# Patient Record
Sex: Female | Born: 1965 | Race: Black or African American | Hispanic: No | Smoking: Never smoker
Health system: Southern US, Community
[De-identification: ages and names within clinical notes are randomized; demographics above are authoritative.]

## PROBLEM LIST (undated history)

## (undated) DIAGNOSIS — B379 Candidiasis, unspecified: Secondary | ICD-10-CM

## (undated) DIAGNOSIS — N159 Renal tubulo-interstitial disease, unspecified: Secondary | ICD-10-CM

## (undated) DIAGNOSIS — A499 Bacterial infection, unspecified: Secondary | ICD-10-CM

## (undated) DIAGNOSIS — N920 Excessive and frequent menstruation with regular cycle: Secondary | ICD-10-CM

## (undated) DIAGNOSIS — O009 Unspecified ectopic pregnancy without intrauterine pregnancy: Secondary | ICD-10-CM

## (undated) DIAGNOSIS — N6019 Diffuse cystic mastopathy of unspecified breast: Secondary | ICD-10-CM

## (undated) DIAGNOSIS — E119 Type 2 diabetes mellitus without complications: Secondary | ICD-10-CM

## (undated) DIAGNOSIS — F419 Anxiety disorder, unspecified: Secondary | ICD-10-CM

## (undated) DIAGNOSIS — N926 Irregular menstruation, unspecified: Secondary | ICD-10-CM

## (undated) DIAGNOSIS — D219 Benign neoplasm of connective and other soft tissue, unspecified: Secondary | ICD-10-CM

## (undated) DIAGNOSIS — N83209 Unspecified ovarian cyst, unspecified side: Secondary | ICD-10-CM

## (undated) DIAGNOSIS — M1990 Unspecified osteoarthritis, unspecified site: Secondary | ICD-10-CM

## (undated) DIAGNOSIS — A599 Trichomoniasis, unspecified: Secondary | ICD-10-CM

## (undated) DIAGNOSIS — N760 Acute vaginitis: Secondary | ICD-10-CM

## (undated) DIAGNOSIS — G4700 Insomnia, unspecified: Secondary | ICD-10-CM

## (undated) DIAGNOSIS — Z8619 Personal history of other infectious and parasitic diseases: Secondary | ICD-10-CM

## (undated) HISTORY — DX: Bacterial infection, unspecified: A49.9

## (undated) HISTORY — DX: Insomnia, unspecified: G47.00

## (undated) HISTORY — DX: Type 2 diabetes mellitus without complications: E11.9

## (undated) HISTORY — DX: Anxiety disorder, unspecified: F41.9

## (undated) HISTORY — PX: MYOMECTOMY: SHX85

## (undated) HISTORY — DX: Benign neoplasm of connective and other soft tissue, unspecified: D21.9

## (undated) HISTORY — DX: Acute vaginitis: N76.0

## (undated) HISTORY — DX: Unspecified ectopic pregnancy without intrauterine pregnancy: O00.90

## (undated) HISTORY — PX: LAPAROSCOPY: SHX197

## (undated) HISTORY — DX: Renal tubulo-interstitial disease, unspecified: N15.9

## (undated) HISTORY — DX: Candidiasis, unspecified: B37.9

## (undated) HISTORY — DX: Irregular menstruation, unspecified: N92.6

## (undated) HISTORY — DX: Unspecified osteoarthritis, unspecified site: M19.90

## (undated) HISTORY — DX: Trichomoniasis, unspecified: A59.9

## (undated) HISTORY — DX: Unspecified ovarian cyst, unspecified side: N83.209

## (undated) HISTORY — DX: Personal history of other infectious and parasitic diseases: Z86.19

## (undated) HISTORY — DX: Excessive and frequent menstruation with regular cycle: N92.0

## (undated) HISTORY — DX: Diffuse cystic mastopathy of unspecified breast: N60.19

---

## 2000-09-03 ENCOUNTER — Encounter: Admission: RE | Admit: 2000-09-03 | Discharge: 2000-12-02 | Payer: Self-pay | Admitting: Internal Medicine

## 2001-03-18 ENCOUNTER — Other Ambulatory Visit: Admission: RE | Admit: 2001-03-18 | Discharge: 2001-03-18 | Payer: Self-pay | Admitting: Obstetrics and Gynecology

## 2001-08-05 ENCOUNTER — Inpatient Hospital Stay (HOSPITAL_COMMUNITY): Admission: AD | Admit: 2001-08-05 | Discharge: 2001-08-05 | Payer: Self-pay | Admitting: Obstetrics and Gynecology

## 2001-08-05 ENCOUNTER — Encounter: Payer: Self-pay | Admitting: Obstetrics and Gynecology

## 2001-08-08 ENCOUNTER — Inpatient Hospital Stay (HOSPITAL_COMMUNITY): Admission: AD | Admit: 2001-08-08 | Discharge: 2001-08-08 | Payer: Self-pay | Admitting: Obstetrics and Gynecology

## 2002-03-18 ENCOUNTER — Other Ambulatory Visit: Admission: RE | Admit: 2002-03-18 | Discharge: 2002-03-18 | Payer: Self-pay | Admitting: Obstetrics and Gynecology

## 2002-05-06 ENCOUNTER — Encounter: Payer: Self-pay | Admitting: Obstetrics and Gynecology

## 2002-05-06 ENCOUNTER — Encounter: Admission: RE | Admit: 2002-05-06 | Discharge: 2002-05-06 | Payer: Self-pay | Admitting: Obstetrics and Gynecology

## 2003-05-11 ENCOUNTER — Other Ambulatory Visit: Admission: RE | Admit: 2003-05-11 | Discharge: 2003-05-11 | Payer: Self-pay | Admitting: Obstetrics and Gynecology

## 2004-02-22 ENCOUNTER — Ambulatory Visit (HOSPITAL_COMMUNITY): Admission: RE | Admit: 2004-02-22 | Discharge: 2004-02-22 | Payer: Self-pay | Admitting: Internal Medicine

## 2004-05-12 ENCOUNTER — Other Ambulatory Visit: Admission: RE | Admit: 2004-05-12 | Discharge: 2004-05-12 | Payer: Self-pay | Admitting: Obstetrics and Gynecology

## 2004-09-29 IMAGING — CT CT HEAD W/O CM
2 of 3 series · 14 of 33 positions shown, 18 images · non-contrast
Comparison: none

CLINICAL DATA: Headache, confusion.

[Series 2: — · sagittal · 0.67mm/px · 1 of 3 slices shown (1 of 2)]
[im 2/3  brain]
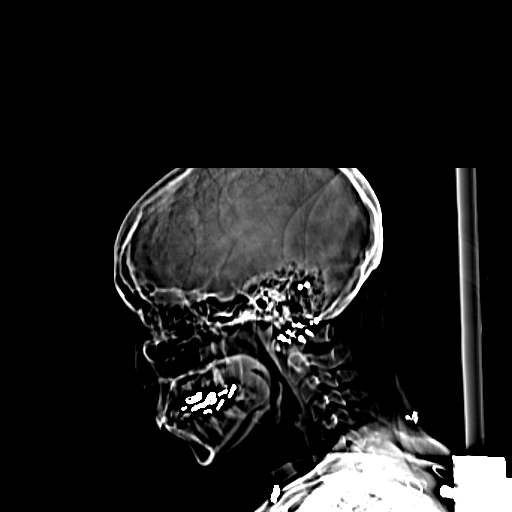

[Series 3: — · axial · 0.43mm/px · z∈[-125,-10]mm · 13 of 29 slices shown, 17 images (2 of 2)]
[im 3/29  brain]
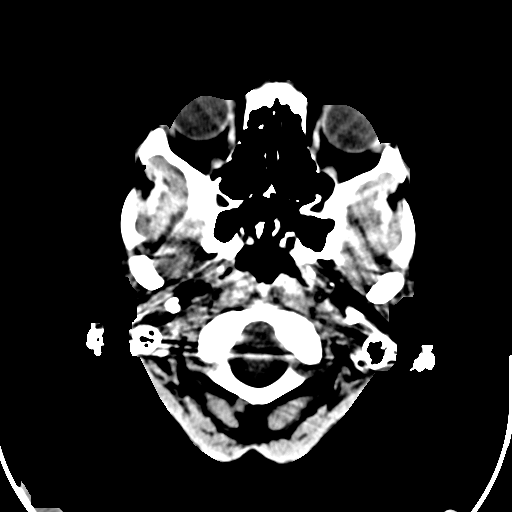
[im 3/29  bone]
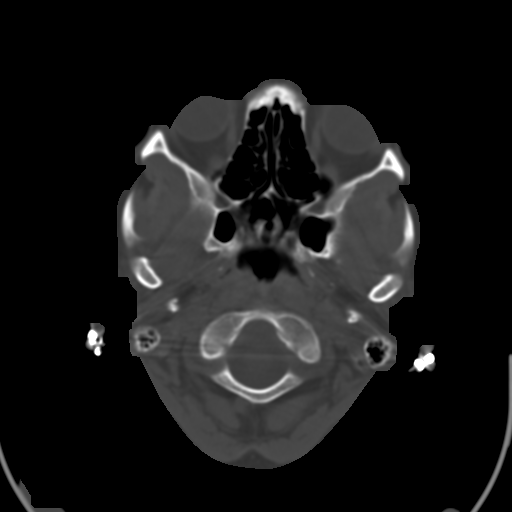
[im 5/29  brain]
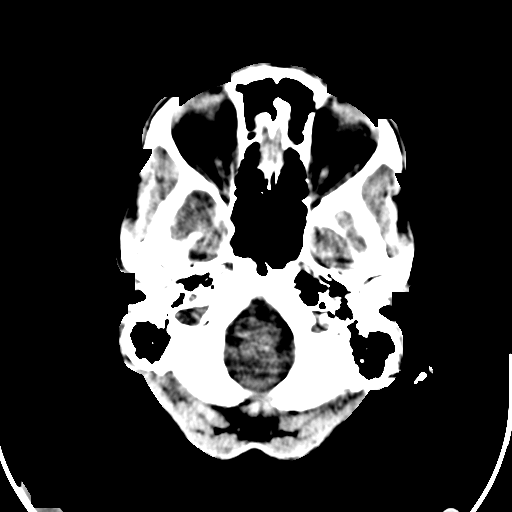
[im 7/29  brain]
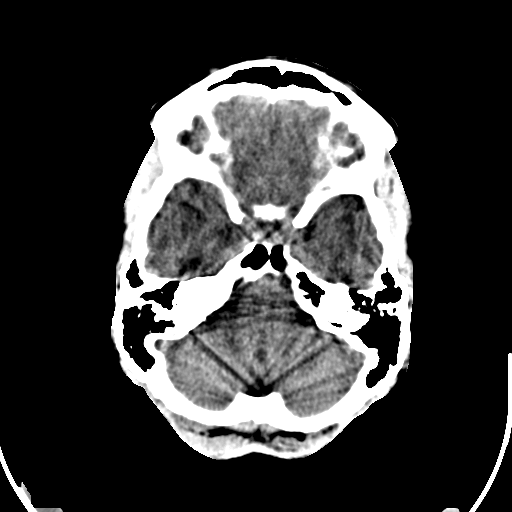
[im 9/29  brain]
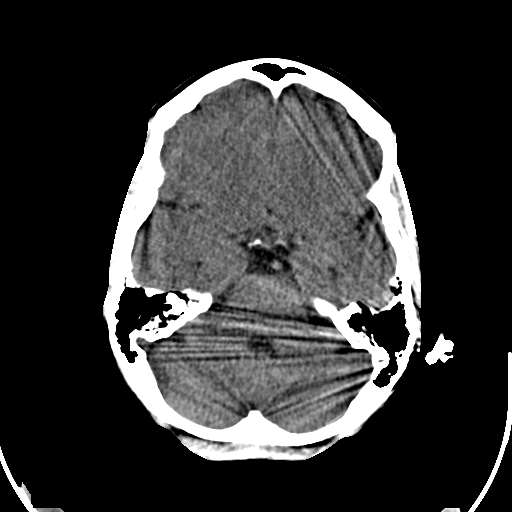
[im 11/29  brain]
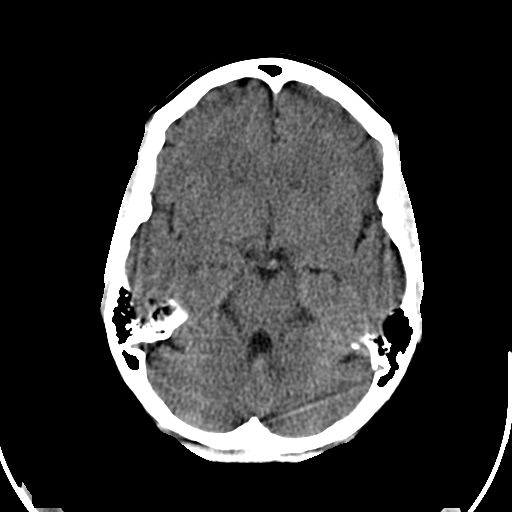
[im 11/29  bone]
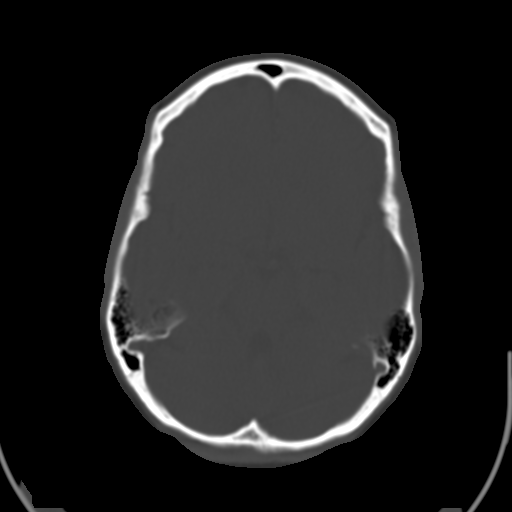
[im 13/29  brain]
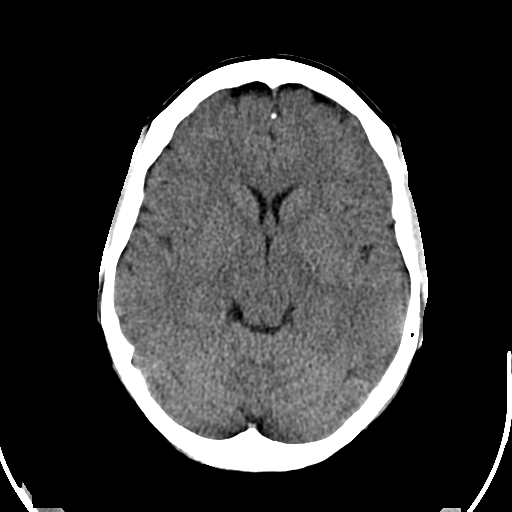
[im 15/29  brain]
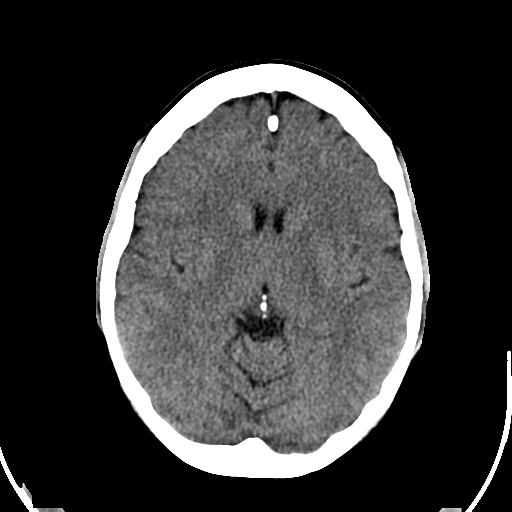
[im 17/29  brain]
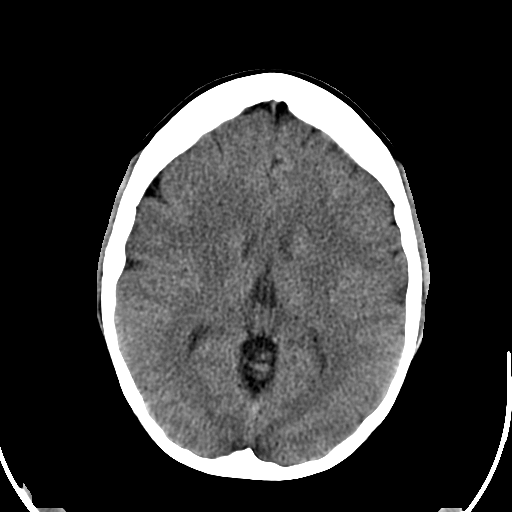
[im 19/29  brain]
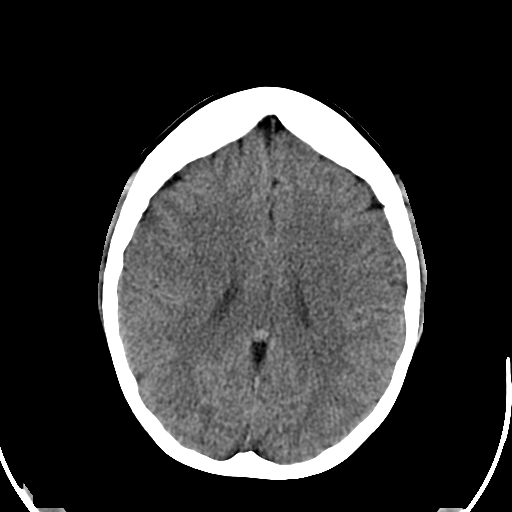
[im 19/29  bone]
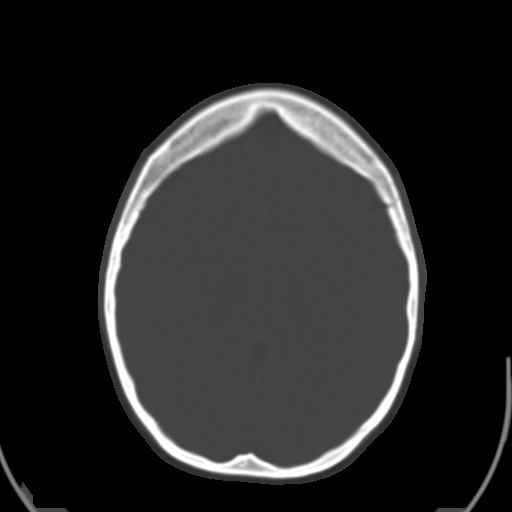
[im 21/29  brain]
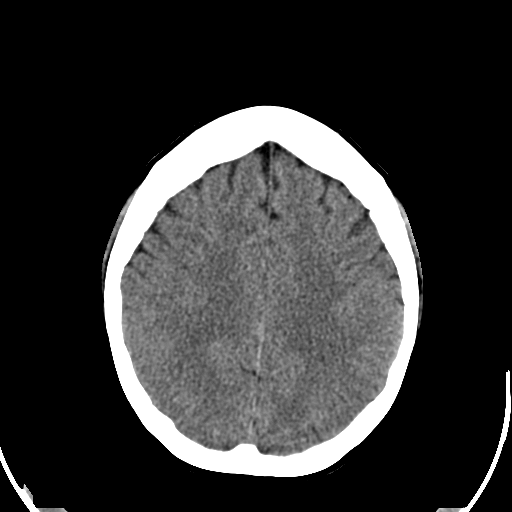
[im 23/29  brain]
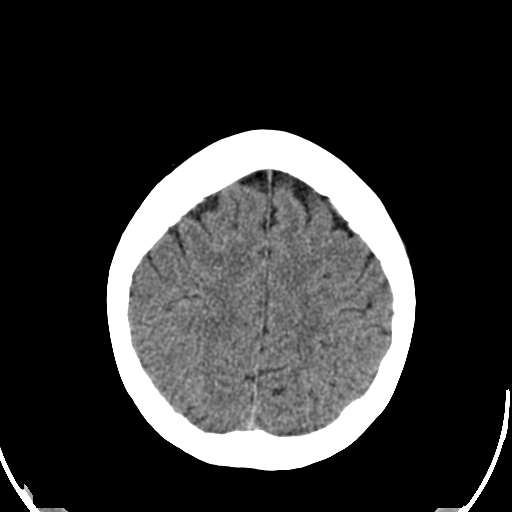
[im 25/29  brain]
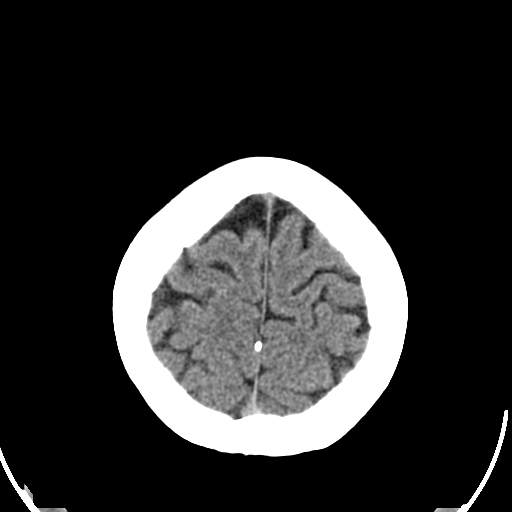
[im 27/29  brain]
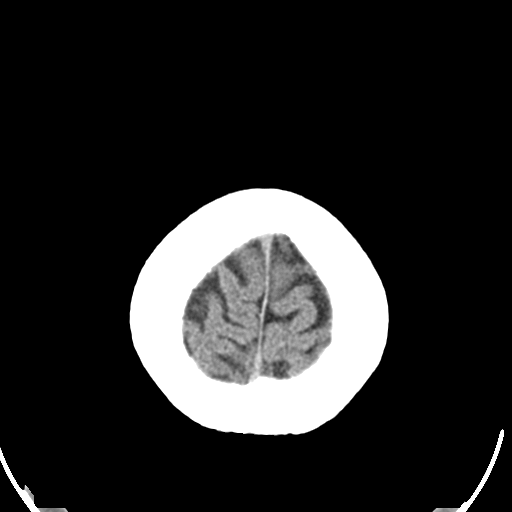
[im 27/29  bone]
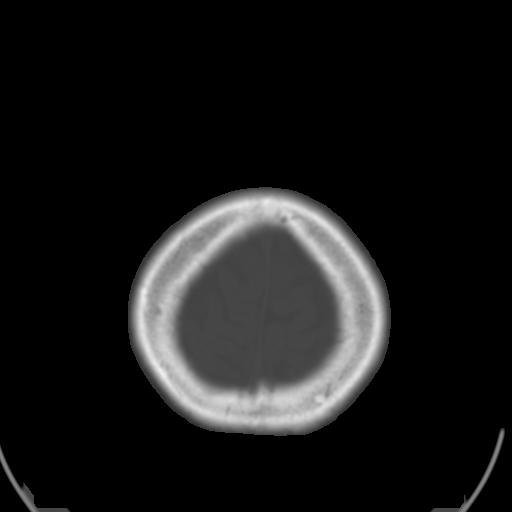

[14 of 33 positions shown; findings below may reference images not displayed]

HEAD CT WITHOUT CONTRAST
 Routine non-contrast head CT was performed. 

 There is no evidence of intracranial hemorrhage, brain edema, or mass effect. The ventricles are normal. No extra-axial abnormalities are identified. Bone windows show no significant abnormalities.

 IMPRESSION
 Negative non-contrast head CT.

## 2005-06-08 ENCOUNTER — Other Ambulatory Visit: Admission: RE | Admit: 2005-06-08 | Discharge: 2005-06-08 | Payer: Self-pay | Admitting: Obstetrics and Gynecology

## 2006-04-10 ENCOUNTER — Encounter: Admission: RE | Admit: 2006-04-10 | Discharge: 2006-04-10 | Payer: Self-pay | Admitting: Obstetrics and Gynecology

## 2007-04-16 ENCOUNTER — Encounter: Admission: RE | Admit: 2007-04-16 | Discharge: 2007-04-16 | Payer: Self-pay | Admitting: Obstetrics and Gynecology

## 2008-04-23 ENCOUNTER — Encounter: Admission: RE | Admit: 2008-04-23 | Discharge: 2008-04-23 | Payer: Self-pay | Admitting: Internal Medicine

## 2009-05-06 ENCOUNTER — Encounter: Admission: RE | Admit: 2009-05-06 | Discharge: 2009-05-06 | Payer: Self-pay | Admitting: Obstetrics and Gynecology

## 2010-05-10 ENCOUNTER — Encounter: Admission: RE | Admit: 2010-05-10 | Discharge: 2010-05-10 | Payer: Self-pay | Admitting: Obstetrics and Gynecology

## 2011-04-20 ENCOUNTER — Other Ambulatory Visit: Payer: Self-pay | Admitting: Obstetrics and Gynecology

## 2011-04-20 DIAGNOSIS — Z1231 Encounter for screening mammogram for malignant neoplasm of breast: Secondary | ICD-10-CM

## 2011-05-16 ENCOUNTER — Ambulatory Visit
Admission: RE | Admit: 2011-05-16 | Discharge: 2011-05-16 | Disposition: A | Discharge status: Home / Self Care | Payer: BC Managed Care – PPO | Source: Ambulatory Visit | Attending: Obstetrics and Gynecology | Admitting: Obstetrics and Gynecology

## 2011-05-16 DIAGNOSIS — Z1231 Encounter for screening mammogram for malignant neoplasm of breast: Secondary | ICD-10-CM

## 2011-05-24 ENCOUNTER — Other Ambulatory Visit: Payer: Self-pay | Admitting: Obstetrics and Gynecology

## 2011-05-24 DIAGNOSIS — R928 Other abnormal and inconclusive findings on diagnostic imaging of breast: Secondary | ICD-10-CM

## 2011-06-08 ENCOUNTER — Ambulatory Visit
Admission: RE | Admit: 2011-06-08 | Discharge: 2011-06-08 | Disposition: A | Discharge status: Home / Self Care | Payer: BC Managed Care – PPO | Source: Ambulatory Visit | Attending: Obstetrics and Gynecology | Admitting: Obstetrics and Gynecology

## 2011-06-08 DIAGNOSIS — R928 Other abnormal and inconclusive findings on diagnostic imaging of breast: Secondary | ICD-10-CM

## 2011-06-09 ENCOUNTER — Other Ambulatory Visit: Payer: BC Managed Care – PPO

## 2012-01-25 ENCOUNTER — Encounter: Payer: Self-pay | Admitting: Obstetrics and Gynecology

## 2012-01-25 ENCOUNTER — Ambulatory Visit (INDEPENDENT_AMBULATORY_CARE_PROVIDER_SITE_OTHER): Payer: BC Managed Care – PPO | Admitting: Obstetrics and Gynecology

## 2012-01-25 VITALS — BP 110/74 | Ht 61.0 in | Wt 139.0 lb

## 2012-01-25 DIAGNOSIS — Z01419 Encounter for gynecological examination (general) (routine) without abnormal findings: Secondary | ICD-10-CM

## 2012-01-25 DIAGNOSIS — Z124 Encounter for screening for malignant neoplasm of cervix: Secondary | ICD-10-CM

## 2012-01-25 DIAGNOSIS — N760 Acute vaginitis: Secondary | ICD-10-CM

## 2012-01-25 DIAGNOSIS — D219 Benign neoplasm of connective and other soft tissue, unspecified: Secondary | ICD-10-CM

## 2012-01-25 DIAGNOSIS — D259 Leiomyoma of uterus, unspecified: Secondary | ICD-10-CM

## 2012-01-25 DIAGNOSIS — B9689 Other specified bacterial agents as the cause of diseases classified elsewhere: Secondary | ICD-10-CM

## 2012-01-25 DIAGNOSIS — G4700 Insomnia, unspecified: Secondary | ICD-10-CM | POA: Insufficient documentation

## 2012-01-25 DIAGNOSIS — A499 Bacterial infection, unspecified: Secondary | ICD-10-CM

## 2012-01-25 DIAGNOSIS — M069 Rheumatoid arthritis, unspecified: Secondary | ICD-10-CM

## 2012-01-25 MED ORDER — ZOLPIDEM TARTRATE 10 MG PO TABS: 10.0000 mg | ORAL_TABLET | Freq: Every evening | ORAL | Status: DC | PRN

## 2012-01-25 NOTE — Progress Notes (Addendum)
The patient reports:normal menses, no abnormal bleeding, pelvic pain or discharge  Contraception:IUD  Last mammogram: was normal October 2012 Last pap: was abnormal: ascus  February  2010  GC/Chlamydia cultures offered: declined HIV/RPR/HbsAg offered:  declined HSV 1 and 2 glycoprotein offered: declined  Menstrual cycle regular and monthly: No: IUD  Menstrual flow normal: No: IUD   Urinary symptoms: none Normal bowel movements: Yes Reports abuse at home: No:   ANNUAL GYNECOLOGIC EXAMINATION   Sara Kaiser is a 46 y.o. female, G1P0010, who presents for an annual exam. See above. The patient had a Mirena IUD placed in January 2009.  She does not have periods.  She is very pleased.  She has rheumatoid arthritis and is doing well.  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  She does well with boric acid suppositories.  She takes Ambien for insomnia.  She has started having hot flashes.  Prior Hysterectomy: No    History   Social History  . Marital Status: Divorced    Spouse Name: N/A    Number of Children: N/A  . Years of Education: N/A   Social History Main Topics  . Smoking status: Never Smoker   . Smokeless tobacco: Never Used  . Alcohol Use: No  . Drug Use: No  . Sexually Active: Yes    Birth Control/ Protection: IUD   Other Topics Concern  . None   Social History Narrative  . None    Menstrual cycle:   LMP: No LMP recorded. Patient is not currently having periods (Reason: IUD).           Cycle: no cycle because of IUD  The following portions of the patient's history were reviewed and updated as appropriate: allergies, current medications, past family history, past medical history, past social history, past surgical history and problem list.  Review of Systems Pertinent items are noted in HPI. Breast:Negative for breast lump,nipple discharge or nipple retraction Gastrointestinal: Negative for abdominal pain, change in bowel habits or rectal  bleeding Urinary:negative   Objective:    BP 110/74  Ht 5\' 1"  (1.549 m)  Wt 139 lb (63.05 kg)  BMI 26.26 kg/m2    Weight:  Wt Readings from Last 1 Encounters:  01/25/12 139 lb (63.05 kg)          BMI: Body mass index is 26.26 kg/(m^2).  General Appearance: Alert, appropriate appearance for age. No acute distress HEENT: Grossly normal Neck / Thyroid: Supple, no masses, nodes or enlargement Lungs: clear to auscultation bilaterally Back: No CVA tenderness Breast Exam: No masses or nodes.No dimpling, nipple retraction or discharge. Cardiovascular: Regular rate and rhythm. S1, S2, no murmur Gastrointestinal: Soft, non-tender, no masses or organomegaly  ++++++++++++++++++++++++++++++++++++++++++++++++++++++++  Pelvic Exam: External genitalia: normal general appearance Vaginal: normal without tenderness, induration or masses and relaxation: No Cervix: normal appearance, IUD string present Adnexa: normal bimanual exam Uterus: normal size, shape, and consistency Rectovaginal: normal rectal, no masses  ++++++++++++++++++++++++++++++++++++++++++++++++++++++++  Lymphatic Exam: Non-palpable nodes in neck, clavicular, axillary, or inguinal regions Neurologic: Normal speech, no tremor  Psychiatric: Alert and oriented, appropriate affect.   Wet Prep:   not applicable Urinalysis:  not applicable UPT:           Not done   Assessment:    Normal gyn exam   Overweight or obese: No   Pelvic relaxation: No  Rheumatoid arthritis  Insomnia  Menopausal symptoms  Recurrent vaginosis   Plan:    mammogram pap smear return annually or prn Contraception:IUD, Remove old  IUD and reinsert new one in January 2014   Medications prescribed: Ambien 10 mg 1 tablet daily at bedtime when necessary insomnia, 30 tablets, 2 refills Boric acid suppositories 600 mg one per vagina twice each week, 25, refill for one year  STD screen request: none  RPR: No.   HBsAg: No.  Hepatitis C:  No.  The updated Pap smear screening guidelines were discussed with the patient. The patient requested that I obtain a Pap smear: Yes.  Kegel exercises discussed: No.  Proper diet and regular exercise were reviewed.  Annual mammograms recommended starting at age 75. Proper breast care was discussed.  Screening colonoscopy is recommended beginning at age 60.  Regular health maintenance was reviewed.  Sleep hygiene was discussed.  Adequate calcium and vitamin D intake was emphasized.  Pre-menopause discussed.  The patient will try herbal medications.  Mylinda Latina.D.

## 2012-01-26 ENCOUNTER — Telehealth: Payer: Self-pay | Admitting: Obstetrics and Gynecology

## 2012-01-26 ENCOUNTER — Other Ambulatory Visit: Payer: Self-pay | Admitting: Obstetrics and Gynecology

## 2012-01-26 NOTE — Telephone Encounter (Signed)
Boric acid suppositories 600 mg one per vagina twice each week, #30, refill x1 year, called to [facility].  Patient notified.  Dr. Stefano Gaul

## 2012-01-28 LAB — PAP IG W/ RFLX HPV ASCU

## 2012-04-26 ENCOUNTER — Other Ambulatory Visit: Payer: Self-pay | Admitting: Obstetrics and Gynecology

## 2012-04-26 DIAGNOSIS — Z1231 Encounter for screening mammogram for malignant neoplasm of breast: Secondary | ICD-10-CM

## 2012-06-05 ENCOUNTER — Ambulatory Visit
Admission: RE | Admit: 2012-06-05 | Discharge: 2012-06-05 | Disposition: A | Discharge status: Home / Self Care | Payer: BC Managed Care – PPO | Source: Ambulatory Visit | Attending: Obstetrics and Gynecology | Admitting: Obstetrics and Gynecology

## 2012-06-05 DIAGNOSIS — Z1231 Encounter for screening mammogram for malignant neoplasm of breast: Secondary | ICD-10-CM

## 2012-06-13 ENCOUNTER — Encounter: Payer: Self-pay | Admitting: Obstetrics and Gynecology

## 2012-06-18 ENCOUNTER — Telehealth: Payer: Self-pay | Admitting: Obstetrics and Gynecology

## 2012-06-20 NOTE — Telephone Encounter (Signed)
AVS pt

## 2012-06-21 ENCOUNTER — Telehealth: Payer: Self-pay

## 2012-06-21 NOTE — Telephone Encounter (Signed)
Pt was called back regarding "DENSE BREAST LETTER" Pt not ava Left message for pt to call back  [facility] CMA

## 2012-06-27 ENCOUNTER — Telehealth: Payer: Self-pay

## 2012-06-27 NOTE — Telephone Encounter (Signed)
Pt questions and concerns answered about dense breast.  Pt voiced understanding and stated she does not need to come in for evaluation.   [facility] CMA

## 2012-07-30 ENCOUNTER — Ambulatory Visit: Payer: BC Managed Care – PPO | Admitting: Obstetrics and Gynecology

## 2012-07-30 ENCOUNTER — Encounter: Payer: Self-pay | Admitting: Obstetrics and Gynecology

## 2012-07-30 VITALS — BP 104/60 | Ht 61.0 in | Wt 137.0 lb

## 2012-07-30 DIAGNOSIS — Z30433 Encounter for removal and reinsertion of intrauterine contraceptive device: Secondary | ICD-10-CM

## 2012-07-30 DIAGNOSIS — Z139 Encounter for screening, unspecified: Secondary | ICD-10-CM

## 2012-07-30 LAB — POCT URINE PREGNANCY: Preg Test, Ur: NEGATIVE

## 2012-07-30 MED ORDER — LEVONORGESTREL 20 MCG/24HR IU IUD
INTRAUTERINE_SYSTEM | Freq: Once | INTRAUTERINE | Status: AC
  Administered 2012-07-30: 1 via INTRAUTERINE

## 2012-07-30 MED ORDER — HYDROCODONE-ACETAMINOPHEN 5-500 MG PO TABS: 2.0000 | ORAL_TABLET | Freq: Four times a day (QID) | ORAL | Status: AC | PRN

## 2012-07-30 MED ORDER — IBUPROFEN 600 MG PO TABS: ORAL_TABLET | ORAL | Status: DC

## 2012-07-30 NOTE — Progress Notes (Signed)
IUD INSERTION NOTE  Sara Kaiser is a 47 y.o. female G1P0010 who presents for IUD removal and re-insertion.  Consent signed after risks and benefits were reviewed including but not limited to bleeding, infection, expulsion and risk of uterine perforation that may require an additional procedure for removal.  LMP: No LMP recorded. Patient is not currently having periods (Reason: IUD). UPT: negative  MIRENA LOT NUMBER: [id number]  Uterus assessed for size and position Prepped with Betadine IUD removed without difficulty Re-prepped with Betadine   Tenaculum placed on anterior lip of cervix after Hurricane gel was applied Uterus sounded at  7  cm Insertion of MIRENA IUD per protocol without any complications Strings trimmed   Assessment:  IUD Insertion  Plan:  1. Patient instructed to call with oral temperature of 100.4 degrees Fahrenheit or more, excessive bleeding or pain that is not relieved with OTC analgesia taken as directed  2. Patient instructed on how  to check IUD strings and encouraged to do so after each menstrual cycle  3. Advised not to place anything in vagina or have sexual intercourse for 7 days  4. Follow-up: 4  weeks  VICODIN  #12 1-2 every 6 hours as needed for pain  Ibuprofen 600 mg #30 1 po pc q 6 hours x 3 days then prn pain no refill  Francena Zender PA-C 07/30/2012 12:51 PM

## 2012-07-30 NOTE — Patient Instructions (Signed)
Call [facility] [phone]:  -for temperature of 100.4 degrees Fahrenheit or more -pain not improved with over the counter pain medications (Ibuprofen, Advil, Aleve,        Tylenol or acetaminophen) -for excessive bleeding (more than a usual period) -for any other concerns  Do not place anything in your vagina for the next 7 days    

## 2012-08-02 ENCOUNTER — Encounter: Payer: BC Managed Care – PPO | Admitting: Obstetrics and Gynecology

## 2012-08-23 ENCOUNTER — Encounter: Payer: BC Managed Care – PPO | Admitting: Obstetrics and Gynecology

## 2013-05-09 ENCOUNTER — Other Ambulatory Visit: Payer: Self-pay

## 2013-05-09 DIAGNOSIS — Z1231 Encounter for screening mammogram for malignant neoplasm of breast: Secondary | ICD-10-CM

## 2013-06-30 ENCOUNTER — Ambulatory Visit
Admission: RE | Admit: 2013-06-30 | Discharge: 2013-06-30 | Disposition: A | Discharge status: Home / Self Care | Payer: BC Managed Care – PPO | Source: Ambulatory Visit

## 2013-06-30 DIAGNOSIS — Z1231 Encounter for screening mammogram for malignant neoplasm of breast: Secondary | ICD-10-CM

## 2014-05-11 ENCOUNTER — Encounter: Payer: Self-pay | Admitting: Obstetrics and Gynecology

## 2014-06-02 ENCOUNTER — Other Ambulatory Visit: Payer: Self-pay

## 2014-06-02 DIAGNOSIS — Z1231 Encounter for screening mammogram for malignant neoplasm of breast: Secondary | ICD-10-CM

## 2014-07-01 ENCOUNTER — Ambulatory Visit
Admission: RE | Admit: 2014-07-01 | Discharge: 2014-07-01 | Disposition: A | Discharge status: Home / Self Care | Payer: BC Managed Care – PPO | Source: Ambulatory Visit

## 2014-07-01 DIAGNOSIS — Z1231 Encounter for screening mammogram for malignant neoplasm of breast: Secondary | ICD-10-CM

## 2015-11-27 ENCOUNTER — Ambulatory Visit (INDEPENDENT_AMBULATORY_CARE_PROVIDER_SITE_OTHER): Payer: BC Managed Care – PPO | Admitting: Physician Assistant

## 2015-11-27 VITALS — BP 102/68 | HR 94 | Temp 98.0°F | Resp 18 | Ht 61.0 in | Wt 142.0 lb

## 2015-11-27 DIAGNOSIS — J069 Acute upper respiratory infection, unspecified: Secondary | ICD-10-CM

## 2015-11-27 DIAGNOSIS — B9789 Other viral agents as the cause of diseases classified elsewhere: Principal | ICD-10-CM

## 2015-11-27 MED ORDER — HYDROCODONE-HOMATROPINE 5-1.5 MG/5ML PO SYRP: 2.5000 mL | ORAL_SOLUTION | Freq: Every evening | ORAL | Status: DC | PRN

## 2015-11-27 NOTE — Patient Instructions (Addendum)
-   You have a viral upper respiratory infection, (the common cold) and it is not uncommon for symptoms to last 10 days to 2 weeks, regardless of which medications you take. Unfortunately antibiotics will not help your symptoms as they target bacteria, and you are likely suffering from a virus. Additionally, 1 in 8 people who take antibiotics suffer mild to severe side effects.    - You would benefit from high dose ibuprofen. TAKE 600 mg of ibuprofen every 8 hours as needed to control low grade fever, fatigue, and pain.    - I also recommend that you take Zyrtec-D 5-120 every morning as directed on the box for the next five to 10 days. This medication will help you with nasal congestion, post nasal drip and sneezing. You will have to purchase this directly from the pharmacist     Please visit the CDC's website [employer].com.cy to learn about appropriate and inappropriate uses of antibiotics.       IF you received an x-ray today, you will receive an invoice from [facility]. Please contact [facility] at [phone] with questions or concerns regarding your invoice.   IF you received labwork today, you will receive an invoice from [employer]. Please contact [facility] at [phone] with questions or concerns regarding your invoice.   Our billing staff will not be able to assist you with questions regarding bills from these companies.  You will be contacted with the lab results as soon as they are available. The fastest way to get your results is to activate your My Chart account. Instructions are located on the last page of this paperwork. If you have not heard from [place] regarding the results in 2 weeks, please contact this office.

## 2015-11-27 NOTE — Progress Notes (Signed)
   11/27/2015 2:23 PM   DOB: 01/27/1966 / MRN: [id number]  SUBJECTIVE:  Sara Kaiser is a 50 y.o. female presenting for a sore throat.  This started 3 days ago and is improving.  She associates nasal congestion, rhinorrhea, cough. She has tried multiple OTC preparations without much improvement.   She has No Known Allergies.   She  has a past medical history of Kidney infection; Diabetes mellitus ([place]); History of chicken pox; History of mumps; Yeast infection; Bacterial infection; Fibroids; Trichomonas; Irregular menses; Anxiety; Ectopic pregnancy; Fibrocystic breast changes; Insomnia; Ovarian cyst; Menorrhagia; Vaginosis; and Arthritis.    She  reports that she has never smoked. She has never used smokeless tobacco. She reports that she does not drink alcohol or use illicit drugs. She  reports that she currently engages in sexual activity. She reports using the following method of birth control/protection: IUD. The patient  has past surgical history that includes laparoscopy and Myomectomy.  Her family history includes Cancer in her paternal grandfather; Diabetes in her father, maternal grandmother, mother, and paternal grandmother; Hypertension in her father and mother; Stroke in her father.  Review of Systems  Constitutional: Negative for fever and chills.  Cardiovascular: Negative for chest pain.  Musculoskeletal: Negative for myalgias.  Skin: Negative for rash.  Neurological: Negative for dizziness and headaches.  Psychiatric/Behavioral: Negative for depression.    Problem list and medications reviewed and updated by myself where necessary, and exist elsewhere in the encounter.   OBJECTIVE:  BP 102/68 mmHg  Pulse 94  Temp(Src) 98 F (36.7 C) (Oral)  Resp 18  Ht 5\' 1"  (1.549 m)  Wt 142 lb (64.411 kg)  BMI 26.84 kg/m2  SpO2 98%  Physical Exam  Constitutional: She is oriented to person, place, and time.  HENT:  Right Ear: External ear normal.  Left Ear: External ear  normal.  Nose: Mucosal edema present. Right sinus exhibits no maxillary sinus tenderness and no frontal sinus tenderness. Left sinus exhibits no maxillary sinus tenderness and no frontal sinus tenderness.  Mouth/Throat: Oropharynx is clear and moist. No oropharyngeal exudate.  Eyes: Conjunctivae are normal. Pupils are equal, round, and reactive to light.  Cardiovascular: Regular rhythm and normal heart sounds.   Pulmonary/Chest: Effort normal and breath sounds normal. She has no rales.  Neurological: She is alert and oriented to person, place, and time.  Skin: Skin is warm and dry. No rash noted. She is not diaphoretic. No erythema.  Psychiatric: Her behavior is normal.    No results found for this or any previous visit (from the past 72 hour(s)).  No results found.  ASSESSMENT AND PLAN  Sara Kaiser was seen today for sinusitis, uri, sore throat and cough.  Diagnoses and all orders for this visit:  Viral URI with cough: Vitals normal no alarm signs.  See AVS for patient guidance on meds.  Hycodan prn.  -     HYDROcodone-homatropine (HYCODAN) 5-1.5 MG/5ML syrup; Take 2.5-5 mLs by mouth at bedtime as needed.    The patient was advised to call or return to clinic if she does not see an improvement in symptoms or to seek the care of the closest emergency department if she worsens with the above plan.   Philis Fendt, MHS, PA-C [facility] 11/27/2015 2:23 PM

## 2017-05-02 ENCOUNTER — Ambulatory Visit
Admission: RE | Admit: 2017-05-02 | Discharge: 2017-05-02 | Disposition: A | Discharge status: Home / Self Care | Payer: BC Managed Care – PPO | Source: Ambulatory Visit | Attending: Internal Medicine | Admitting: Internal Medicine

## 2017-05-02 ENCOUNTER — Other Ambulatory Visit: Payer: Self-pay | Admitting: Internal Medicine

## 2017-05-02 DIAGNOSIS — R1032 Left lower quadrant pain: Secondary | ICD-10-CM

## 2017-05-02 DIAGNOSIS — R109 Unspecified abdominal pain: Secondary | ICD-10-CM

## 2017-05-02 DIAGNOSIS — R110 Nausea: Secondary | ICD-10-CM

## 2017-05-03 ENCOUNTER — Encounter (HOSPITAL_COMMUNITY): Payer: Self-pay | Admitting: Obstetrics and Gynecology

## 2017-05-03 ENCOUNTER — Inpatient Hospital Stay (HOSPITAL_COMMUNITY)
Admission: AD | Admit: 2017-05-03 | Discharge: 2017-05-04 | Disposition: A | Discharge status: Home / Self Care | Payer: BC Managed Care – PPO | Source: Ambulatory Visit | Attending: Obstetrics and Gynecology | Admitting: Obstetrics and Gynecology

## 2017-05-03 DIAGNOSIS — Z7984 Long term (current) use of oral hypoglycemic drugs: Secondary | ICD-10-CM | POA: Diagnosis not present

## 2017-05-03 DIAGNOSIS — K5909 Other constipation: Secondary | ICD-10-CM | POA: Insufficient documentation

## 2017-05-03 DIAGNOSIS — T8332XA Displacement of intrauterine contraceptive device, initial encounter: Secondary | ICD-10-CM | POA: Diagnosis not present

## 2017-05-03 DIAGNOSIS — Z791 Long term (current) use of non-steroidal anti-inflammatories (NSAID): Secondary | ICD-10-CM | POA: Insufficient documentation

## 2017-05-03 DIAGNOSIS — E119 Type 2 diabetes mellitus without complications: Secondary | ICD-10-CM | POA: Insufficient documentation

## 2017-05-03 DIAGNOSIS — X58XXXA Exposure to other specified factors, initial encounter: Secondary | ICD-10-CM | POA: Insufficient documentation

## 2017-05-03 DIAGNOSIS — Z79899 Other long term (current) drug therapy: Secondary | ICD-10-CM | POA: Insufficient documentation

## 2017-05-03 LAB — GLUCOSE, CAPILLARY: Glucose-Capillary: 117 mg/dL — ABNORMAL HIGH (ref 65–99)

## 2017-05-03 NOTE — MAU Note (Signed)
PT SAYS IUD WAS INSERTED AT [facility]- 2014.    OFFICE CALLED PT TODAY .   2 WEEKS AGO  SHE WENT  TO PCP- BECAUSE OF  BLOATING-   HAD XRAY-   SAID   HAD LOT OF  STOOL- GAVE MEDS.   THEN AFTER 10 DAYS  SHE WENT   BACK TO  PCP -    HAD CT ON WED.  THEN  PCP  CALLED HER TODAY AND SAID IUD  HAD WALLED  .  HER PCP CALLED [facility]  AND  [facility]  CALLED  PT AND SAID PA COULD NOT REMOVE - DR Raphael Gibney WOULD BE  BACK Monday.         SO PCP  CALLED PT - SAID COULD NOT WAIT TILL Monday   SO TO COME  TO ER.       NOW  SHE FEELS PAIN IF ABD  TOUCHED .

## 2017-05-03 NOTE — MAU Provider Note (Signed)
History    Sara Kaiser is a 51y.o. Female who presents for IUD removal. Patient states she was seen at GI doctor with c/o bloating and constipation 7-10 days ago. Reports she was sent for CT scan and results showed that her "IUD was walled."  Patient c/o bloating, gas, and pain around the umbilicus.  Further reports history of chronic constipation, with bowel movement today that was small and watery. Intermittent nausea, no vomiting or change in appetite.  Patient denies  problems with urination and reports pain at umbilicus that she describes as "twisting and pulling," but has improved despite no treatment. Patient did have plans to travel, but states she did not go only b/c MD told her to report.  Patient does not feel that symptoms are restricting her ability to perform ADLs.   Patient Active Problem List   Diagnosis Date Noted  . Rheumatoid arthritis(714.0) 01/25/2012  . Fibroid 01/25/2012  . Bacterial vaginosis 01/25/2012  . Insomnia 01/25/2012    Chief Complaint  Patient presents with  . Abdominal Pain   HPI  OB History    Gravida Para Term Preterm AB Living   1 0     1     SAB TAB Ectopic Multiple Live Births       1          Past Medical History:  Diagnosis Date  . Anxiety   . Arthritis   . Bacterial infection   . Diabetes mellitus ([place])   . Ectopic pregnancy   . Fibrocystic breast changes   . Fibroids   . History of chicken pox   . History of mumps   . Insomnia   . Irregular menses   . Kidney infection   . Menorrhagia   . Ovarian cyst   . Trichomonas   . Vaginosis   . Yeast infection     Past Surgical History:  Procedure Laterality Date  . LAPAROSCOPY    . MYOMECTOMY      Family History  Problem Relation Age of Onset  . Diabetes Mother   . Hypertension Mother   . Diabetes Father   . Stroke Father   . Hypertension Father   . Diabetes Maternal Grandmother   . Diabetes Paternal Grandmother   . Cancer Paternal Grandfather     Social History   Substance Use Topics  . Smoking status: Never Smoker  . Smokeless tobacco: Never Used  . Alcohol use No    Allergies: No Known Allergies  Prescriptions Prior to Admission  Medication Sig Dispense Refill Last Dose  . folic acid (FOLVITE) 1 MG tablet Take 1 mg by mouth daily.   05/03/2017 at Unknown time  . metFORMIN (GLUCOPHAGE) 500 MG tablet Take 500 mg by mouth 2 (two) times daily with a meal.   05/03/2017 at Unknown time  . methotrexate 2.5 MG tablet Take by mouth 3 (three) times a week.   Past Week at Unknown time  . ramipril (ALTACE) 1.25 MG tablet Take 1.25 mg by mouth daily. Reported on 11/27/2015   05/03/2017 at Unknown time  . valACYclovir (VALTREX) 500 MG tablet Take by mouth 2 (two) times daily.   05/03/2017 at Unknown time  . Canagliflozin (INVOKANA) 100 MG TABS Take 100 mg by mouth 1 day or 1 dose. Reported on 11/27/2015   Not Taking  . HYDROcodone-homatropine (HYCODAN) 5-1.5 MG/5ML syrup Take 2.5-5 mLs by mouth at bedtime as needed. 60 mL 0   . ibuprofen (ADVIL,MOTRIN) 600 MG tablet  1 po pc q 6 hours x 3 days then as needed for pain 30 tablet 0 Unknown at Unknown time  . mupirocin ointment (BACTROBAN) 2 % Apply topically 3 (three) times daily.   Taking  . zolpidem (AMBIEN) 10 MG tablet Take 1 tablet (10 mg total) by mouth at bedtime as needed for sleep. 30 tablet 2     Review of Systems  Constitutional: Negative for chills, fever and malaise/fatigue.  Gastrointestinal: Positive for abdominal pain, constipation and nausea. Negative for vomiting.  Genitourinary: Negative.     See HPI Above Physical Exam   Blood pressure 123/62, pulse 82, temperature 98.3 F (36.8 C), temperature source Oral, resp. rate 18, height 5\' 2"  (1.575 m), weight 63.2 kg (139 lb 4 oz).  Results for orders placed or performed during the hospital encounter of 05/03/17 (from the past 24 hour(s))  Glucose, capillary     Status: Abnormal   Collection Time: 05/03/17 11:43 PM  Result Value Ref Range    Glucose-Capillary 117 (H) 65 - 99 mg/dL  CBC with Differential/Platelet     Status: None   Collection Time: 05/03/17 11:49 PM  Result Value Ref Range   WBC 7.1 4.0 - 10.5 K/uL   RBC 4.59 3.87 - 5.11 MIL/uL   Hemoglobin 14.3 12.0 - 15.0 g/dL   HCT 42.3 36.0 - 46.0 %   MCV 92.2 78.0 - 100.0 fL   MCH 31.2 26.0 - 34.0 pg   MCHC 33.8 30.0 - 36.0 g/dL   RDW 13.6 11.5 - 15.5 %   Platelets 258 150 - 400 K/uL   Neutrophils Relative % 53 %   Neutro Abs 3.7 1.7 - 7.7 K/uL   Lymphocytes Relative 36 %   Lymphs Abs 2.5 0.7 - 4.0 K/uL   Monocytes Relative 8 %   Monocytes Absolute 0.6 0.1 - 1.0 K/uL   Eosinophils Relative 3 %   Eosinophils Absolute 0.2 0.0 - 0.7 K/uL   Basophils Relative 0 %   Basophils Absolute 0.0 0.0 - 0.1 K/uL    Physical Exam  Constitutional: She is oriented to person, place, and time. She appears well-developed and well-nourished. No distress.  HENT:  Head: Normocephalic and atraumatic.  Eyes: Conjunctivae are normal.  Neck: Normal range of motion.  Cardiovascular: Normal rate, regular rhythm and normal heart sounds.   Respiratory: Effort normal and breath sounds normal.  GI: Soft. Bowel sounds are normal. There is tenderness in the periumbilical area. There is no rigidity and no guarding.  Genitourinary:  Genitourinary Comments: Deferred  Musculoskeletal: Normal range of motion. She exhibits no edema.  Neurological: She is alert and oriented to person, place, and time.  Skin: Skin is warm and dry.  Psychiatric: She has a normal mood and affect. Her behavior is normal.     ED Course  Assessment: 51y.o. Female IUD Displacement Chronic Constipation Abdominal Pain   Plan: -In room to discuss symptoms  -Discussed CT findings -Informed that MD would be consulted prior to further evaluation -Patient declines medication for pain at current  Follow Up (2325) -Dr. Florene Route consulted and advised *CBC with Diff *CBG  *Will go and evaluate patient  Follow Up  (2345) -Dr. Florene Route evaluated patient and discussed CT findings -Patient educated on IUD perforation and how her case is non-emergent -Q/C addressed -Patient offered and accepts pain medication -Ibuprofen 600mg  PO Now -Rx sent for Ibuprofen 600mg  Disp 14, RF 0 -[place]na message sent for triage to contact patient to be evaluated and  scheduled for IUD removal -Patient verbalized understanding of POC-Instructed not to go to office w/o an appt -Encouraged to call if any questions or concerns arise prior to next scheduled office visit.  -Discharged to home in stable condition   [place], MSN 05/03/2017 10:34 PM

## 2017-05-04 LAB — CBC WITH DIFFERENTIAL/PLATELET
BASOS ABS: 0 10*3/uL (ref 0.0–0.1)
Basophils Relative: 0 %
Eosinophils Absolute: 0.2 10*3/uL (ref 0.0–0.7)
Eosinophils Relative: 3 %
HEMATOCRIT: 42.3 % (ref 36.0–46.0)
Hemoglobin: 14.3 g/dL (ref 12.0–15.0)
LYMPHS PCT: 36 %
Lymphs Abs: 2.5 10*3/uL (ref 0.7–4.0)
MCH: 31.2 pg (ref 26.0–34.0)
MCHC: 33.8 g/dL (ref 30.0–36.0)
MCV: 92.2 fL (ref 78.0–100.0)
Monocytes Absolute: 0.6 10*3/uL (ref 0.1–1.0)
Monocytes Relative: 8 %
NEUTROS ABS: 3.7 10*3/uL (ref 1.7–7.7)
Neutrophils Relative %: 53 %
Platelets: 258 10*3/uL (ref 150–400)
RBC: 4.59 MIL/uL (ref 3.87–5.11)
RDW: 13.6 % (ref 11.5–15.5)
WBC: 7.1 10*3/uL (ref 4.0–10.5)

## 2017-05-04 MED ORDER — IBUPROFEN 600 MG PO TABS: 600.0000 mg | ORAL_TABLET | Freq: Three times a day (TID) | ORAL | 0 refills | Status: AC | PRN

## 2017-05-04 MED ORDER — IBUPROFEN 600 MG PO TABS
600.0000 mg | ORAL_TABLET | Freq: Once | ORAL | Status: AC
  Administered 2017-05-04: 600 mg via ORAL
  Filled 2017-05-04: qty 1

## 2017-05-04 NOTE — Discharge Instructions (Signed)

## 2017-05-11 ENCOUNTER — Encounter: Payer: Self-pay | Admitting: Gastroenterology

## 2017-05-23 ENCOUNTER — Ambulatory Visit: Payer: BC Managed Care – PPO | Admitting: Nurse Practitioner

## 2017-12-08 IMAGING — CT CT ABD-PELV W/O CM
1 of 2 series · 15 of 32 positions shown, 19 images · non-contrast
Comparison: 04/18/2017 abdominal radiographs.

CLINICAL DATA: Left lower quadrant abdominal pain and nausea.
Constipation. History of myomectomy.

EXAM:
CT ABDOMEN AND PELVIS WITHOUT CONTRAST
TECHNIQUE: Multidetector CT imaging of the abdomen and pelvis was performed
following the standard protocol without IV contrast.

[Series 2: abd/pelvis w/(date) · axial · 0.64mm/px · z∈[-426,-1]mm · 15 of 93 slices shown, 19 images]
[im 4/93  soft-tissue]
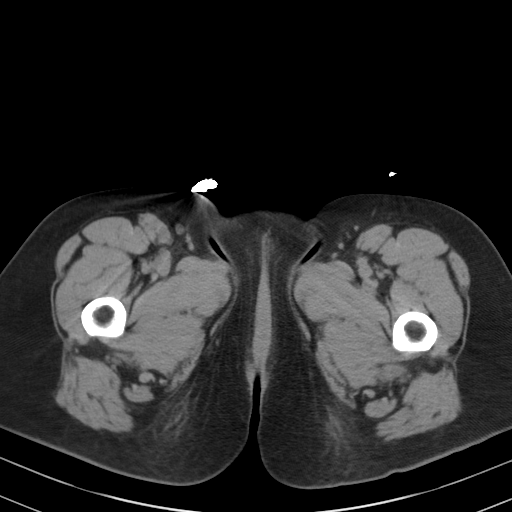
[im 4/93  bone]
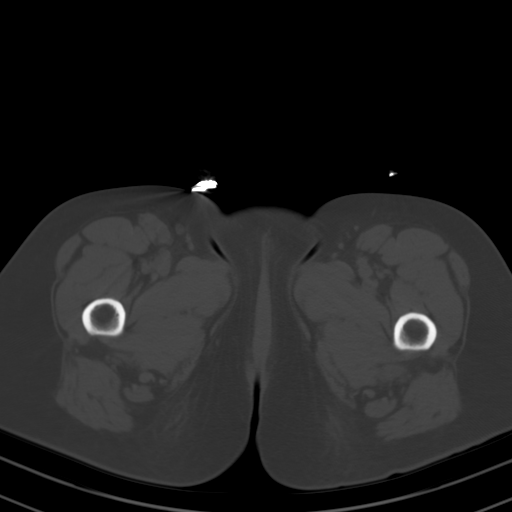
[im 12/93  soft-tissue]
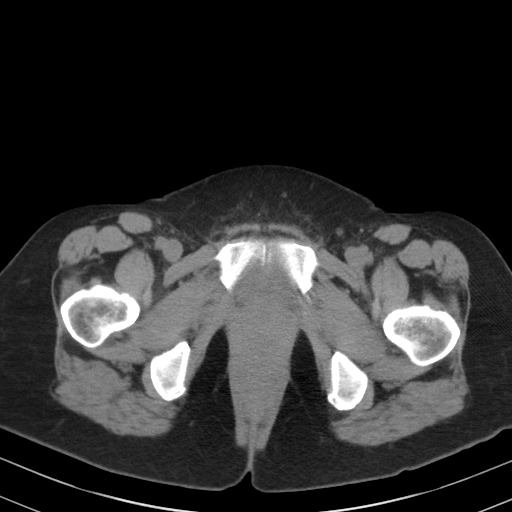
[im 19/93  soft-tissue]
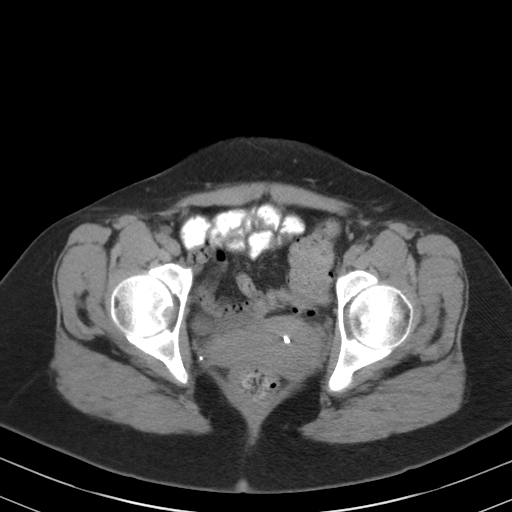
[im 26/93  soft-tissue]
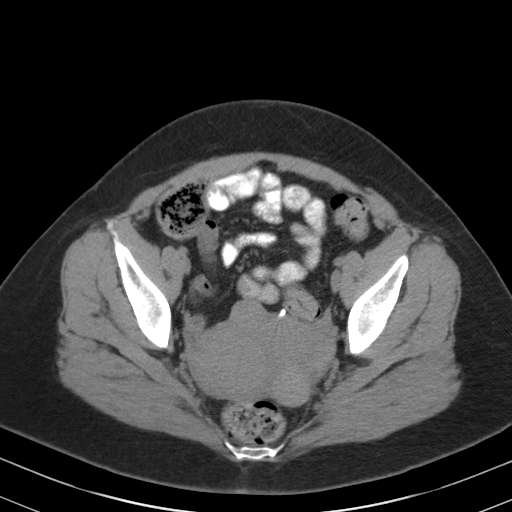
[im 34/93  soft-tissue]
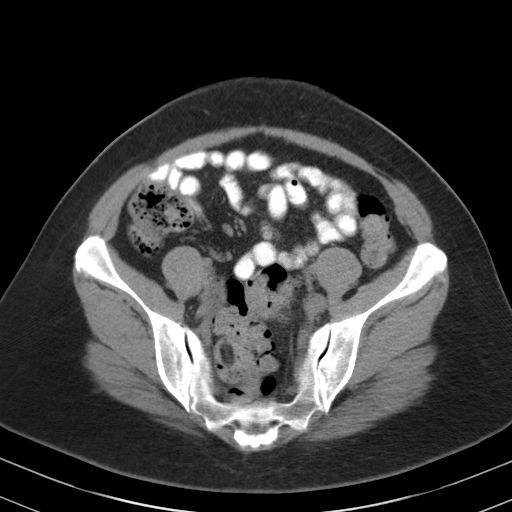
[im 41/93  soft-tissue]
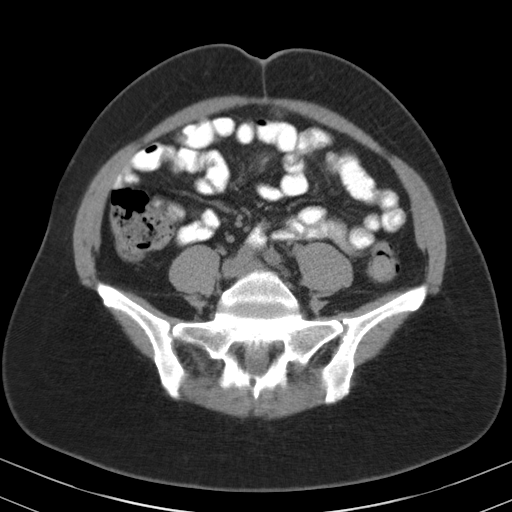
[im 48/93  soft-tissue]
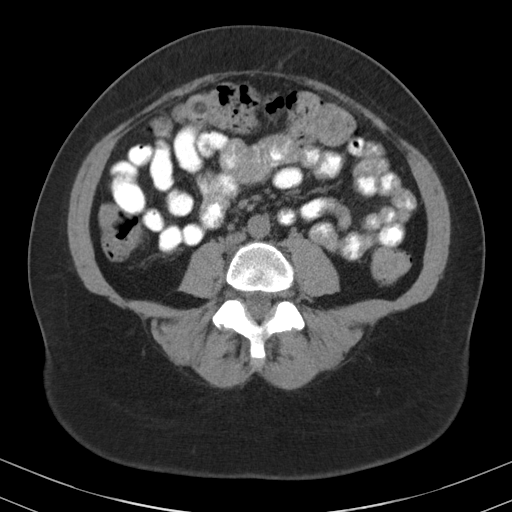
[im 52/93  soft-tissue]
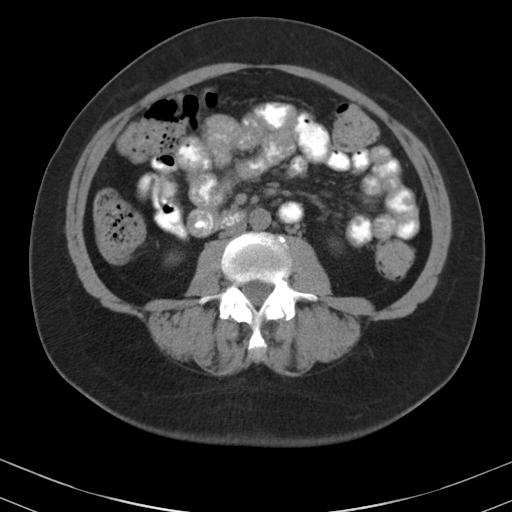
[im 59/93  soft-tissue]
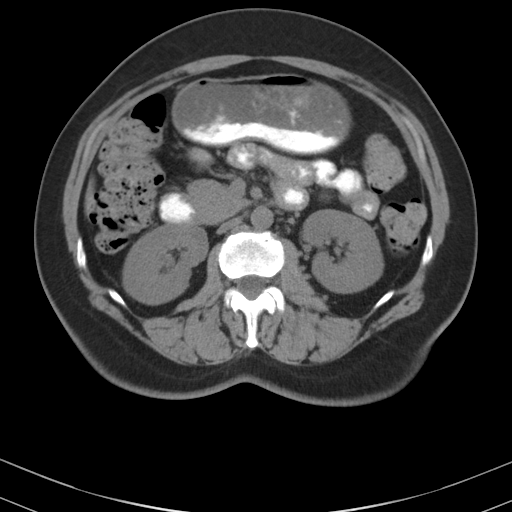
[im 59/93  bone]
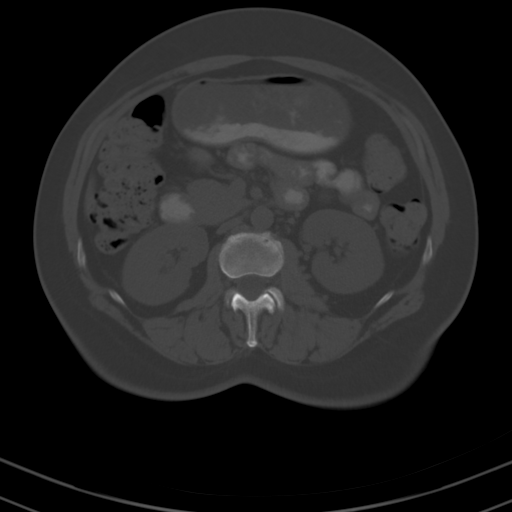
[im 67/93  soft-tissue]
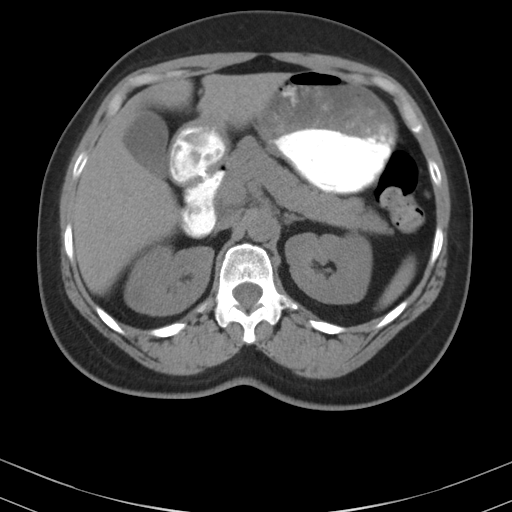
[im 74/93  soft-tissue]
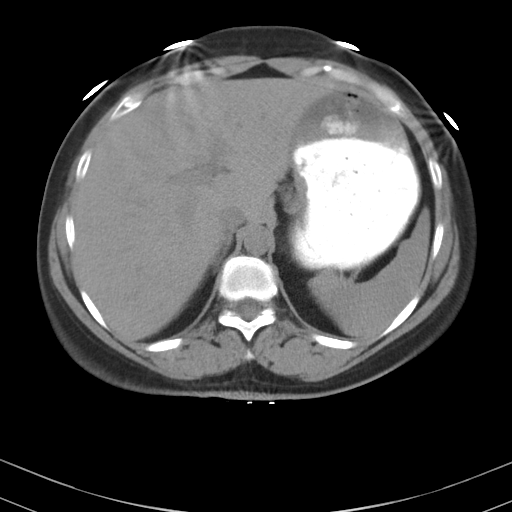
[im 78/93  lung]
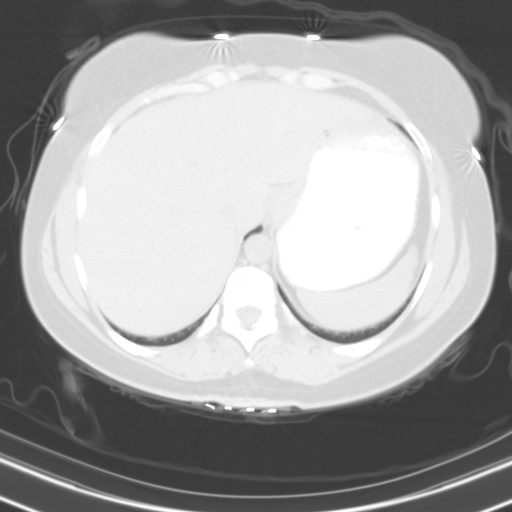
[im 81/93  soft-tissue]
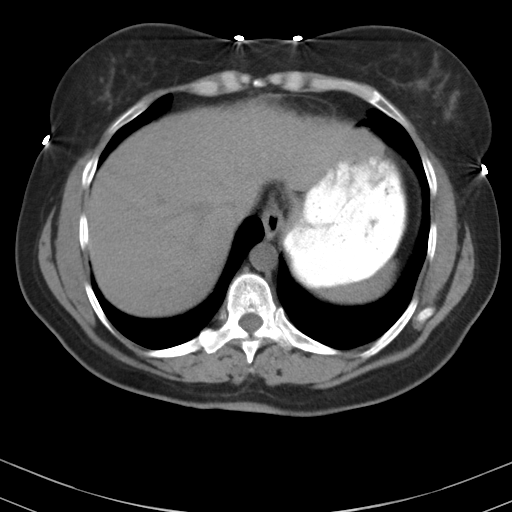
[im 81/93  lung]
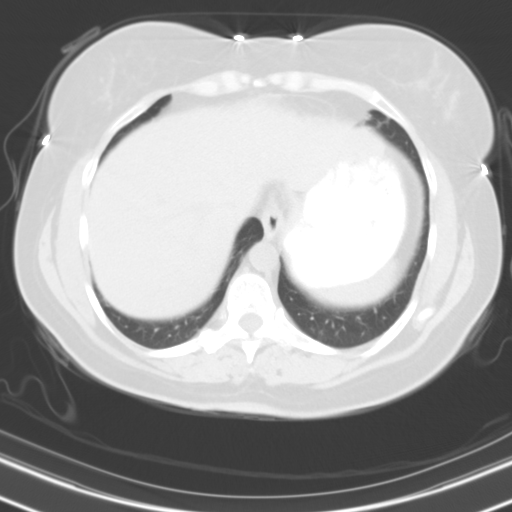
[im 85/93  lung]
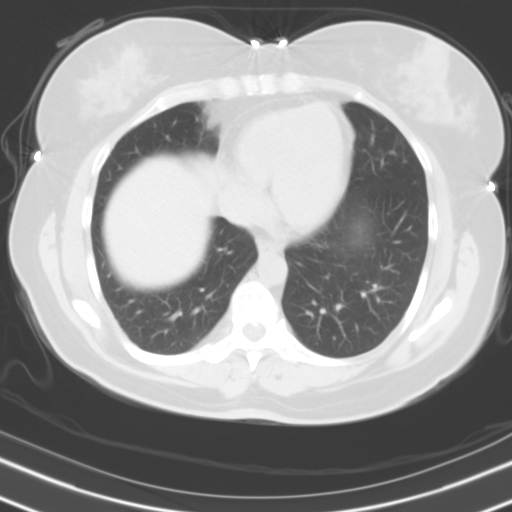
[im 89/93  soft-tissue]
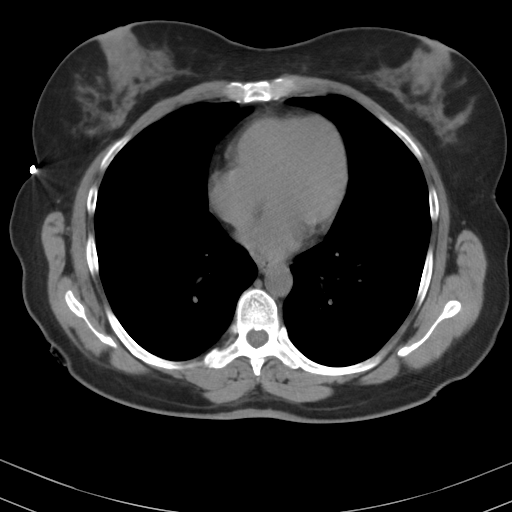
[im 89/93  lung]
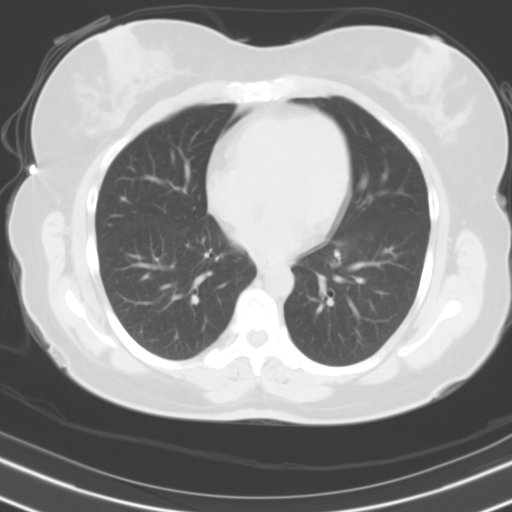

[15 of 32 positions shown; findings below may reference images not displayed]

FINDINGS: Lower chest: No significant pulmonary nodules or acute consolidative
airspace disease.

Hepatobiliary: Normal liver with no liver mass. Normal gallbladder
with no radiopaque cholelithiasis. No biliary ductal dilatation.

Pancreas: Normal, with no mass or duct dilation.

Spleen: Normal size. No mass.

Adrenals/Urinary Tract: Normal adrenals. No renal stones. No
hydronephrosis. Simple 1.0 cm renal cyst in the lateral upper right
kidney. No additional contour deforming renal lesions. Collapsed
normal bladder.

Stomach/Bowel: Grossly normal stomach. Normal caliber small bowel
with no small bowel wall thickening. Oral contrast transits to the
pelvic small bowel. Normal appendix. Moderate stool throughout the
large bowel. No large bowel wall thickening or pericolonic fat
stranding.

Vascular/Lymphatic: Normal caliber abdominal aorta. No
pathologically enlarged lymph nodes in the abdomen or pelvis.

Reproductive: Enlarged myomatous uterus. Intrauterine device is
malpositioned in the uterus, with transmural perforation by the left
IUD side arm, which extends approximately 4 mm into the anterior
parametrial fat. No adnexal masses.

Other: No pneumoperitoneum, ascites or focal fluid collection.

Musculoskeletal: No aggressive appearing focal osseous lesions.
IMPRESSION: 1. Malpositioned IUD within the enlarged myomatous uterus, with
transmural perforation by the left IUD side arm, which extends 4 mm
into the anterior left parametrial fat.
2. No free air.  No abscess.
3. No evidence of bowel obstruction or acute bowel inflammation.
Moderate colonic stool volume suggests constipation.

These results will be called to the ordering clinician or
representative by the Radiologist Assistant, and communication
documented in the PACS or zVision Dashboard.

## 2019-09-06 ENCOUNTER — Other Ambulatory Visit: Payer: Self-pay

## 2019-09-06 ENCOUNTER — Ambulatory Visit: Payer: BC Managed Care – PPO | Attending: Internal Medicine

## 2019-09-06 DIAGNOSIS — Z23 Encounter for immunization: Secondary | ICD-10-CM | POA: Insufficient documentation

## 2019-09-06 NOTE — Progress Notes (Signed)
   Covid-19 Vaccination Clinic  Name:  Shondi Leonelli    MRN: [id number] DOB: 03-22-1966  09/06/2019  Ms. Alfrida Oelkers was observed post Covid-19 immunization for 15 minutes without incidence. She was provided with Vaccine Information Sheet and instruction to access the V-Safe system.   Ms. Santanna Zechman was instructed to call 911 with any severe reactions post vaccine: Marland Kitchen Difficulty breathing  . Swelling of your face and throat  . A fast heartbeat  . A bad rash all over your body  . Dizziness and weakness    Immunizations Administered    Name Date Dose VIS Date Route   Pfizer COVID-19 Vaccine 09/06/2019 12:08 PM 0.3 mL 06/20/2019 Intramuscular   Manufacturer: [place]   Lot: [id number]   [place]: [id number]

## 2019-09-27 ENCOUNTER — Ambulatory Visit: Payer: BC Managed Care – PPO | Attending: Internal Medicine

## 2019-09-27 DIAGNOSIS — Z23 Encounter for immunization: Secondary | ICD-10-CM

## 2019-09-27 NOTE — Progress Notes (Signed)
   [facility]  Name:  Sara Kaiser    MRN: [id number] DOB: 13-Mar-1966  09/27/2019  [name] was observed post Covid-19 immunization for 15 minutes without incident. She was provided with Vaccine Information Sheet and instruction to access the V-Safe system.   [name] was instructed to call 911 with any severe reactions post vaccine: Marland Kitchen Difficulty breathing  . Swelling of face and throat  . A fast heartbeat  . A bad rash all over body  . Dizziness and weakness   Immunizations Administered    Name Date Dose VIS Date Route   Pfizer COVID-19 Vaccine 09/27/2019 11:53 AM 0.3 mL 06/20/2019 Intramuscular   Manufacturer: [place]   Lot: [id number]   [place]: [id number]

## 2019-10-01 ENCOUNTER — Ambulatory Visit: Payer: BC Managed Care – PPO

## 2021-08-11 ENCOUNTER — Other Ambulatory Visit: Payer: Self-pay | Admitting: Internal Medicine

## 2021-08-11 DIAGNOSIS — R634 Abnormal weight loss: Secondary | ICD-10-CM

## 2021-09-05 ENCOUNTER — Other Ambulatory Visit: Payer: Self-pay

## 2021-09-05 ENCOUNTER — Ambulatory Visit
Admission: RE | Admit: 2021-09-05 | Discharge: 2021-09-05 | Disposition: A | Discharge status: Home / Self Care | Payer: BC Managed Care – PPO | Source: Ambulatory Visit | Attending: Internal Medicine | Admitting: Internal Medicine

## 2021-09-05 DIAGNOSIS — R634 Abnormal weight loss: Secondary | ICD-10-CM

## 2021-09-05 MED ORDER — IOPAMIDOL (ISOVUE-300) INJECTION 61%
100.0000 mL | Freq: Once | INTRAVENOUS | Status: AC | PRN
  Administered 2021-09-05: 100 mL via INTRAVENOUS

## 2022-11-01 ENCOUNTER — Other Ambulatory Visit: Payer: Self-pay | Admitting: Internal Medicine

## 2022-11-01 DIAGNOSIS — R0602 Shortness of breath: Secondary | ICD-10-CM

## 2022-12-05 ENCOUNTER — Other Ambulatory Visit: Payer: BC Managed Care – PPO

## 2022-12-15 ENCOUNTER — Ambulatory Visit: Payer: BC Managed Care – PPO

## 2022-12-15 DIAGNOSIS — R0602 Shortness of breath: Secondary | ICD-10-CM
